# Patient Record
Sex: Female | Born: 1960 | Race: White | Hispanic: No
Health system: Southern US, Community
[De-identification: ages and names within clinical notes are randomized; demographics above are authoritative.]

## PROBLEM LIST (undated history)

## (undated) DIAGNOSIS — M1990 Unspecified osteoarthritis, unspecified site: Secondary | ICD-10-CM

---

## 2021-08-21 ENCOUNTER — Other Ambulatory Visit: Payer: Self-pay

## 2021-08-21 ENCOUNTER — Encounter (HOSPITAL_COMMUNITY): Payer: Self-pay | Admitting: Emergency Medicine

## 2021-08-21 ENCOUNTER — Emergency Department (HOSPITAL_COMMUNITY): Payer: No Typology Code available for payment source

## 2021-08-21 ENCOUNTER — Emergency Department (HOSPITAL_COMMUNITY)
Admission: EM | Admit: 2021-08-21 | Discharge: 2021-08-21 | Disposition: A | Discharge status: Home / Self Care | Payer: No Typology Code available for payment source | Attending: Emergency Medicine | Admitting: Emergency Medicine

## 2021-08-21 DIAGNOSIS — M79674 Pain in right toe(s): Secondary | ICD-10-CM | POA: Diagnosis not present

## 2021-08-21 DIAGNOSIS — Y9301 Activity, walking, marching and hiking: Secondary | ICD-10-CM | POA: Diagnosis not present

## 2021-08-21 DIAGNOSIS — Y990 Civilian activity done for income or pay: Secondary | ICD-10-CM | POA: Diagnosis not present

## 2021-08-21 DIAGNOSIS — R229 Localized swelling, mass and lump, unspecified: Secondary | ICD-10-CM | POA: Diagnosis not present

## 2021-08-21 DIAGNOSIS — W1842XA Slipping, tripping and stumbling without falling due to stepping into hole or opening, initial encounter: Secondary | ICD-10-CM | POA: Diagnosis not present

## 2021-08-21 HISTORY — DX: Unspecified osteoarthritis, unspecified site: M19.90

## 2021-08-21 MED ORDER — ACETAMINOPHEN 325 MG PO TABS
650.0000 mg | ORAL_TABLET | Freq: Once | ORAL | Status: DC
  Filled 2021-08-21: qty 2

## 2021-08-21 NOTE — Discharge Instructions (Addendum)
Your x-rays today were reassuring.  Please take Tylenol or ibuprofen for pain.  Apply ice for comfort.  Wear the cam walker as needed.  Please follow-up with orthopedics if your symptoms do not improve.  Return to the ER for any new or worsening symptoms.

## 2021-08-21 NOTE — ED Provider Notes (Signed)
[facility] EMERGENCY DEPARTMENT Provider Note   CSN: [id number] Arrival date & time: 08/21/21  1723     History Chief Complaint  Patient presents with   Toe Pain    Dana Nguyen is a 60 y.o. female.  HPI 60 year-old female presents to the ER with right toe pain.  Patient was walking at work and heels and thinks she stepped in a hole and she felt like she hyperextended her right toe.  She thought initially the pain would subside, however throughout the day she has noticed increasing pain to the right toe with some bruising.  Taken ibuprofen with some relief in her pain.  Denies any numbness.  No visible wounds.    Past Medical History:  Diagnosis Date   Arthritis     There are no problems to display for this patient.   The histories are not reviewed yet. Please review them in the "History" navigator section and refresh this SmartLink.   OB History   No obstetric history on file.     No family history on file.     Home Medications Prior to Admission medications   Not on File    Allergies    Patient has no known allergies.  Review of Systems   Review of Systems Ten systems reviewed and are negative for acute change, except as noted in the HPI.    Physical Exam Updated Vital Signs BP 129/73   Pulse 81   Temp 98.9 F (37.2 C) (Oral)   Resp 14   SpO2 99%   Physical Exam Vitals and nursing note reviewed.  Constitutional:      General: She is not in acute distress.    Appearance: She is well-developed.  HENT:     Head: Normocephalic and atraumatic.  Eyes:     Conjunctiva/sclera: Conjunctivae normal.  Cardiovascular:     Rate and Rhythm: Normal rate and regular rhythm.     Heart sounds: No murmur heard. Pulmonary:     Effort: Pulmonary effort is normal. No respiratory distress.     Breath sounds: Normal breath sounds.  Abdominal:     Palpations: Abdomen is soft.     Tenderness: There is no abdominal tenderness.   Musculoskeletal:        General: Swelling and tenderness present.     Cervical back: Neck supple.     Comments: Right toe with mild to moderate swelling, however flexion and extension of the DIP preserved. No visible open wounds. Bruising noted to the lateral aspect of the right toe.   Skin:    General: Skin is warm and dry.     Findings: Bruising present.  Neurological:     Mental Status: She is alert.    ED Results / Procedures / Treatments   Labs (all labs ordered are listed, but only abnormal results are displayed) Labs Reviewed - No data to display  EKG None  Radiology DG Foot Complete Right  Result Date: 08/21/2021 CLINICAL DATA:  Foot injury EXAM: RIGHT FOOT COMPLETE - 3+ VIEW COMPARISON:  None. FINDINGS: No fracture or malalignment. Small plantar calcaneal spur. Mild degenerative change at the first MTP joint IMPRESSION: No acute osseous abnormality Electronically Signed   By: Jasmine Pang M.D.   On: 08/21/2021 18:52    Procedures Procedures   Medications Ordered in ED Medications  acetaminophen (TYLENOL) tablet 650 mg (has no administration in time range)    ED Course  I have reviewed the triage vital signs  and the nursing notes.  Pertinent labs & imaging results that were available during my care of the patient were reviewed by me and considered in my medical decision making (see chart for details).    MDM Rules/Calculators/A&P                           60 year old female with right toe pain.  Plain films without evidence of fracture.  No evidence of wounds, neurovascularly intact.  Encouraged Tylenol/ibuprofen for pain.  Will provide cam walker per patient request for comfort.  Will provide orthopedic follow-up if her symptoms do not improve.  We discussed return precautions.  She was understanding and is agreeable.  Stable for discharge. Final Clinical Impression(s) / ED Diagnoses Final diagnoses:  Toe pain, right    Rx / DC Orders ED Discharge Orders      None        Leone Brand 08/21/21 2038    Melene Plan, DO 08/21/21 2041

## 2021-08-21 NOTE — ED Provider Notes (Signed)
Emergency Medicine Provider Triage Evaluation Note  Dana Nguyen , a 60 y.o. female  was evaluated in triage.  Pt complains of right great toe injury. Injured toe when it got stuck in a hole. No other injuries.   Review of Systems  Positive: arthralgia Negative: fever  Physical Exam  BP 129/73   Pulse 81   Temp 98.9 F (37.2 C) (Oral)   Resp 14   SpO2 99%  Gen:   Awake, no distress   Resp:  Normal effort  MSK:   Moves extremities without difficulty  Other:  Ecchymosis to right great toe and second toe.  Tenderness throughout first and second toe.  Medical Decision Making  Medically screening exam initiated at 5:50 PM.  Appropriate orders placed.  Dana Nguyen was informed that the remainder of the evaluation will be completed by another provider, this initial triage assessment does not replace that evaluation, and the importance of remaining in the ED until their evaluation is complete.  X-ray to rule out bony fractures   Dana Stabile, PA-C 08/21/21 1751    Dana Nguyen, Dana Seal, DO 08/21/21 [id number]

## 2021-08-21 NOTE — Progress Notes (Signed)
Orthopedic Tech Progress Note Patient Details:  Dana Nguyen 22-Sep-1961 [id number]  Ortho Devices Type of Ortho Device: CAM walker Ortho Device/Splint Location: RLE Ortho Device/Splint Interventions: Ordered, Application, Adjustment   Post Interventions Patient Tolerated: Well Instructions Provided: Adjustment of device, Care of device, Poper ambulation with device  Sai Zinn 08/21/2021, 9:03 PM

## 2021-08-21 NOTE — ED Triage Notes (Signed)
Patient complains of an injury to her right great toe that occurred when she believes she stepped in a whole while walking through grass and fell to the ground.

## 2022-06-19 ENCOUNTER — Other Ambulatory Visit: Payer: Self-pay | Admitting: Physician Assistant

## 2022-06-19 DIAGNOSIS — Z87891 Personal history of nicotine dependence: Secondary | ICD-10-CM

## 2022-07-20 ENCOUNTER — Other Ambulatory Visit: Payer: Self-pay

## 2022-08-06 ENCOUNTER — Ambulatory Visit
Admission: RE | Admit: 2022-08-06 | Discharge: 2022-08-06 | Disposition: A | Discharge status: Home / Self Care | Payer: Managed Care, Other (non HMO) | Source: Ambulatory Visit | Attending: Physician Assistant | Admitting: Physician Assistant

## 2022-08-06 ENCOUNTER — Inpatient Hospital Stay: Admission: RE | Admit: 2022-08-06 | Payer: Self-pay | Source: Ambulatory Visit

## 2022-08-06 DIAGNOSIS — Z87891 Personal history of nicotine dependence: Secondary | ICD-10-CM

## 2022-10-06 IMAGING — DX DG FOOT COMPLETE 3+V*R*
3 series · 3 of 3 positions shown · non-contrast
Comparison: None.

CLINICAL DATA: Foot injury

EXAM:
RIGHT FOOT COMPLETE - 3+ VIEW

[x foot ap right]
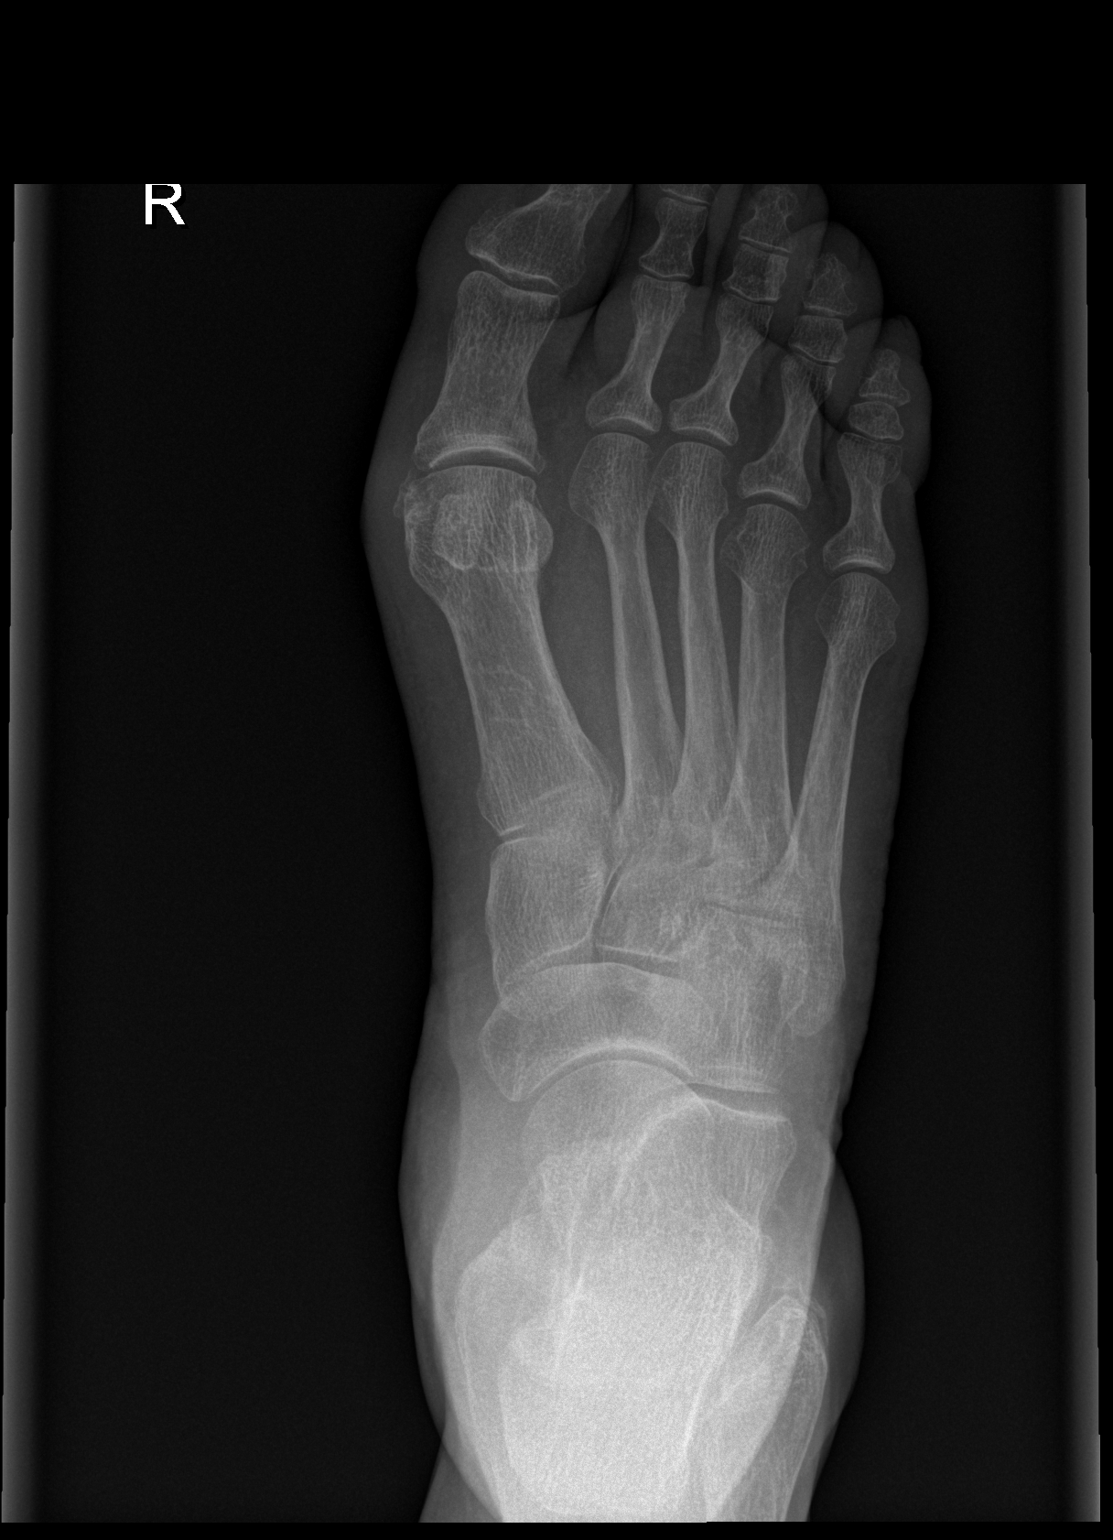

[x foot obl right]
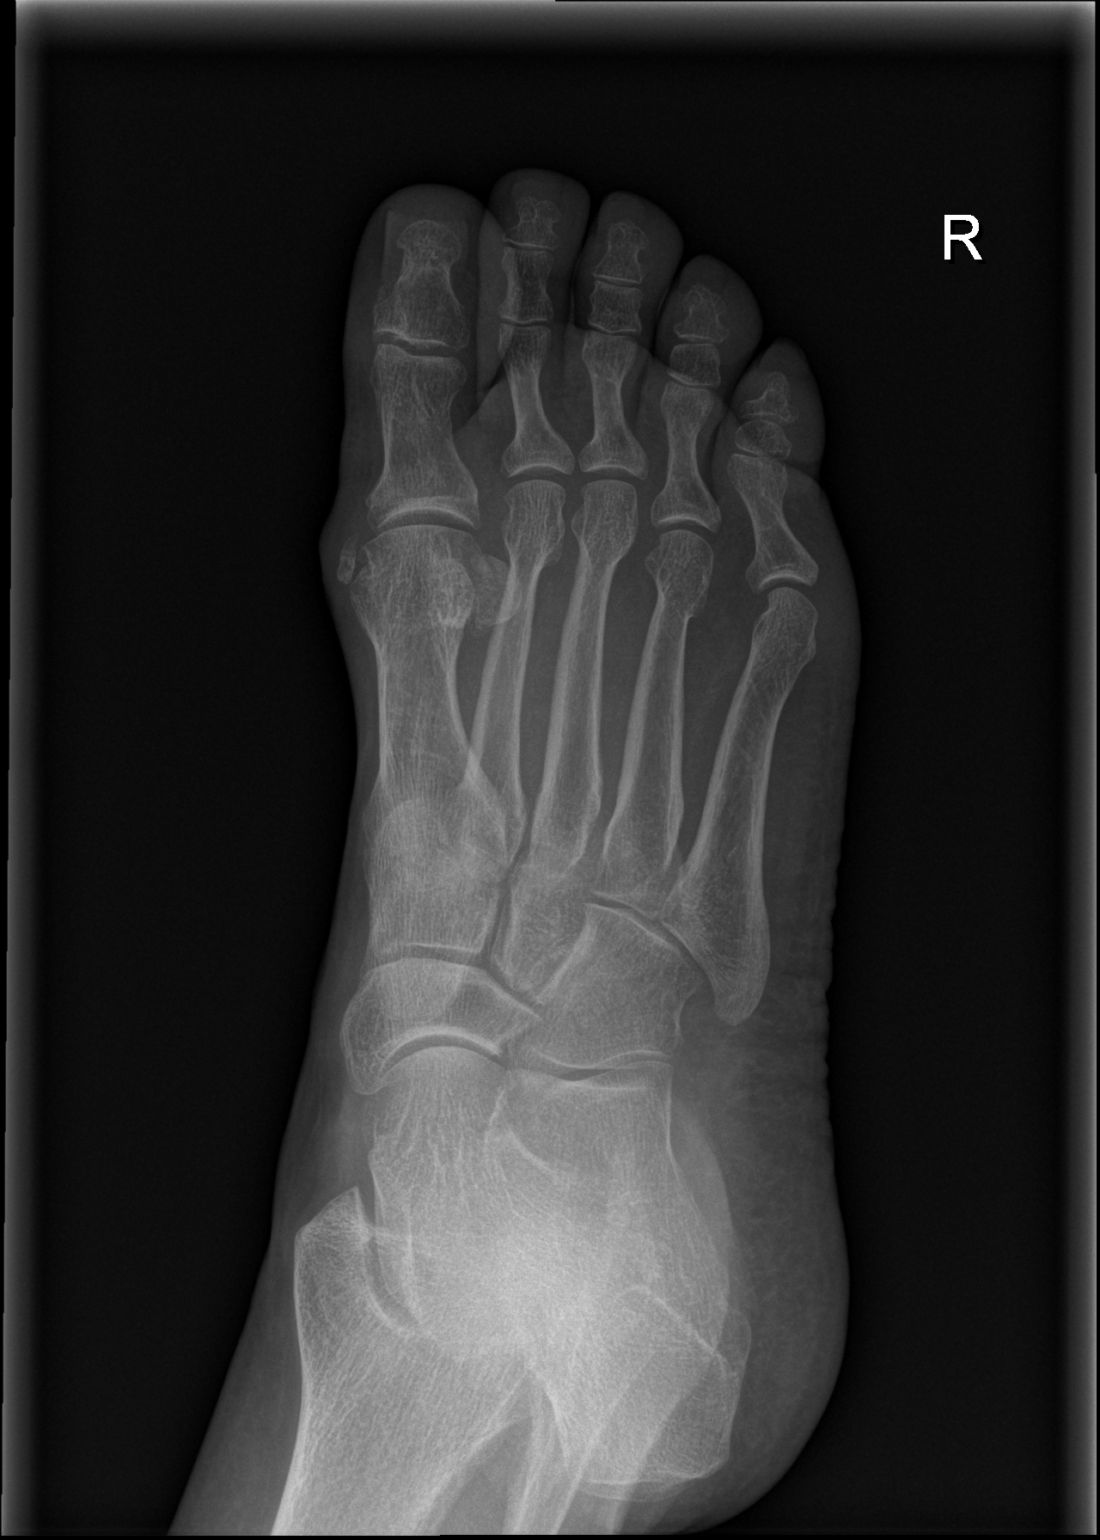

[x foot lat right]
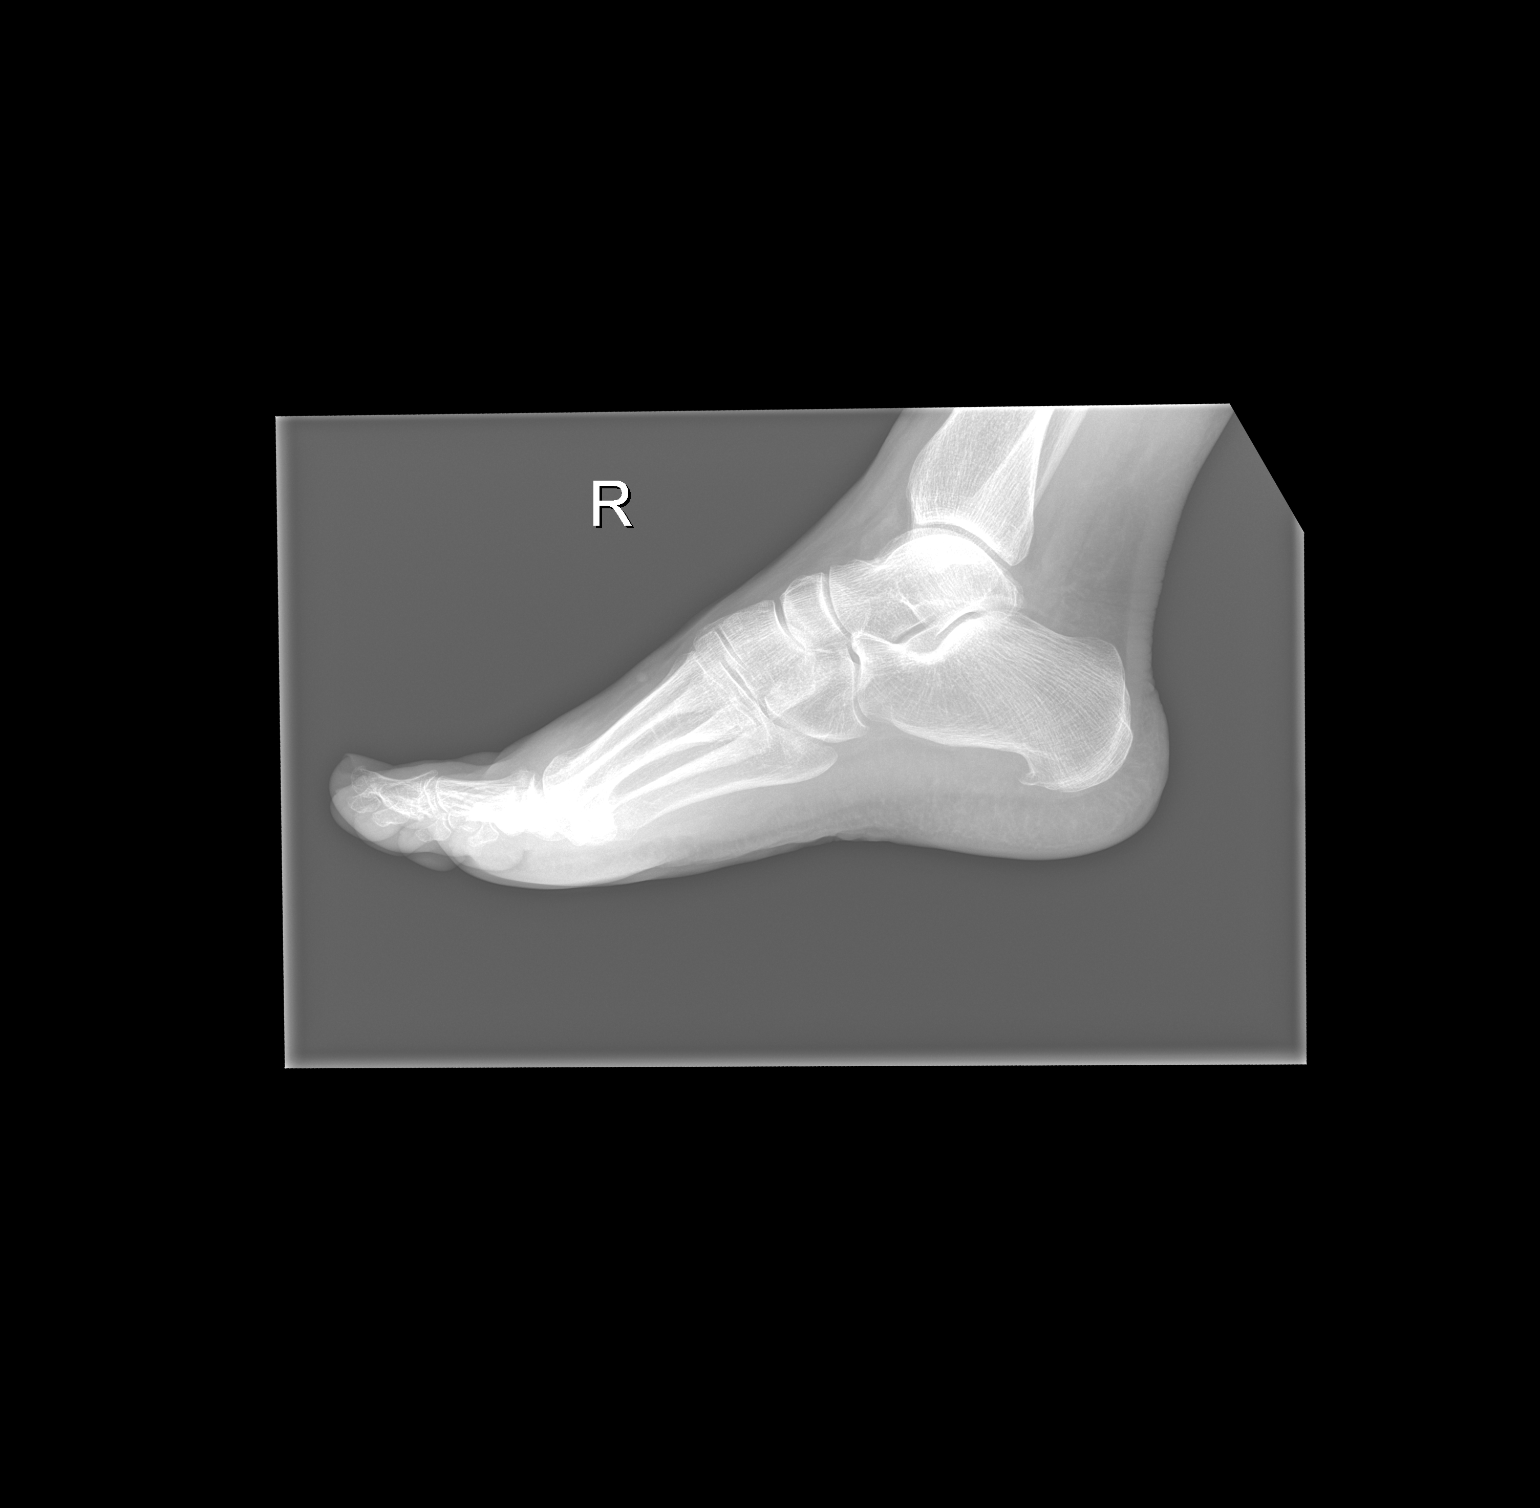

[3 of 3 positions shown; findings below may reference images not displayed]

FINDINGS: No fracture or malalignment. Small plantar calcaneal spur. Mild
degenerative change at the first MTP joint
IMPRESSION: No acute osseous abnormality
